# Patient Record
Sex: Male | Born: 1975 | Race: Black or African American | Hispanic: No | Marital: Single | Smoking: Never smoker
Health system: Southern US, Community
[De-identification: ages and names within clinical notes are randomized; demographics above are authoritative.]

## PROBLEM LIST (undated history)

## (undated) ENCOUNTER — Ambulatory Visit (HOSPITAL_COMMUNITY): Admission: EM | Payer: BLUE CROSS/BLUE SHIELD

## (undated) HISTORY — PX: HAND SURGERY: SHX662

## (undated) HISTORY — PX: OTHER SURGICAL HISTORY: SHX169

## (undated) HISTORY — PX: HEEL SPUR SURGERY: SHX665

---

## 1998-02-09 ENCOUNTER — Inpatient Hospital Stay (HOSPITAL_COMMUNITY): Admission: EM | Admit: 1998-02-09 | Discharge: 1998-02-12 | Payer: Self-pay | Admitting: Emergency Medicine

## 1998-02-10 ENCOUNTER — Encounter: Payer: Self-pay | Admitting: Orthopedic Surgery

## 1998-04-26 ENCOUNTER — Encounter: Admission: RE | Admit: 1998-04-26 | Discharge: 1998-07-25 | Payer: Self-pay | Admitting: Orthopedic Surgery

## 1999-09-02 ENCOUNTER — Encounter: Payer: Self-pay | Admitting: Emergency Medicine

## 1999-09-02 ENCOUNTER — Inpatient Hospital Stay (HOSPITAL_COMMUNITY): Admission: EM | Admit: 1999-09-02 | Discharge: 1999-09-18 | Payer: Self-pay

## 1999-09-03 ENCOUNTER — Encounter: Payer: Self-pay | Admitting: General Surgery

## 1999-09-04 ENCOUNTER — Encounter: Payer: Self-pay | Admitting: General Surgery

## 1999-09-05 ENCOUNTER — Encounter: Payer: Self-pay | Admitting: General Surgery

## 1999-09-06 ENCOUNTER — Encounter: Payer: Self-pay | Admitting: Surgery

## 1999-09-16 ENCOUNTER — Encounter: Payer: Self-pay | Admitting: General Surgery

## 1999-09-18 ENCOUNTER — Inpatient Hospital Stay (HOSPITAL_COMMUNITY)
Admission: RE | Admit: 1999-09-18 | Discharge: 1999-10-04 | Payer: Self-pay | Admitting: Physical Medicine and Rehabilitation

## 1999-09-25 ENCOUNTER — Encounter: Payer: Self-pay | Admitting: Physical Medicine and Rehabilitation

## 1999-09-25 ENCOUNTER — Encounter: Payer: Self-pay | Admitting: Orthopedic Surgery

## 1999-10-02 ENCOUNTER — Encounter: Payer: Self-pay | Admitting: Orthopedic Surgery

## 1999-10-06 ENCOUNTER — Encounter: Payer: Self-pay | Admitting: Emergency Medicine

## 1999-10-06 ENCOUNTER — Inpatient Hospital Stay (HOSPITAL_COMMUNITY): Admission: EM | Admit: 1999-10-06 | Discharge: 1999-10-08 | Payer: Self-pay | Admitting: Emergency Medicine

## 1999-11-13 ENCOUNTER — Encounter: Admission: RE | Admit: 1999-11-13 | Discharge: 2000-02-11 | Payer: Self-pay | Admitting: Orthopedic Surgery

## 2001-09-01 ENCOUNTER — Emergency Department (HOSPITAL_COMMUNITY): Admission: EM | Admit: 2001-09-01 | Discharge: 2001-09-01 | Payer: Self-pay | Admitting: Emergency Medicine

## 2001-09-01 ENCOUNTER — Encounter: Payer: Self-pay | Admitting: Emergency Medicine

## 2007-07-04 ENCOUNTER — Emergency Department (HOSPITAL_COMMUNITY): Admission: EM | Admit: 2007-07-04 | Discharge: 2007-07-04 | Payer: Self-pay | Admitting: Emergency Medicine

## 2007-07-15 ENCOUNTER — Emergency Department (HOSPITAL_COMMUNITY): Admission: EM | Admit: 2007-07-15 | Discharge: 2007-07-15 | Payer: Self-pay | Admitting: Emergency Medicine

## 2007-12-07 ENCOUNTER — Emergency Department (HOSPITAL_COMMUNITY): Admission: EM | Admit: 2007-12-07 | Discharge: 2007-12-07 | Payer: Self-pay | Admitting: Family Medicine

## 2008-03-17 ENCOUNTER — Encounter: Payer: Self-pay | Admitting: Pulmonary Disease

## 2008-03-17 DIAGNOSIS — R0602 Shortness of breath: Secondary | ICD-10-CM | POA: Insufficient documentation

## 2008-11-14 ENCOUNTER — Emergency Department (HOSPITAL_COMMUNITY): Admission: EM | Admit: 2008-11-14 | Discharge: 2008-11-14 | Payer: Self-pay | Admitting: Emergency Medicine

## 2010-04-26 ENCOUNTER — Emergency Department (HOSPITAL_COMMUNITY)
Admission: EM | Admit: 2010-04-26 | Discharge: 2010-04-26 | Disposition: A | Discharge status: Home / Self Care | Payer: Self-pay | Attending: Emergency Medicine | Admitting: Emergency Medicine

## 2010-04-26 DIAGNOSIS — R300 Dysuria: Secondary | ICD-10-CM | POA: Insufficient documentation

## 2010-04-26 DIAGNOSIS — A64 Unspecified sexually transmitted disease: Secondary | ICD-10-CM | POA: Insufficient documentation

## 2010-04-26 LAB — URINALYSIS, ROUTINE W REFLEX MICROSCOPIC
Bilirubin Urine: NEGATIVE
Glucose, UA: NEGATIVE mg/dL
Ketones, ur: NEGATIVE mg/dL
Nitrite: NEGATIVE
Protein, ur: 30 mg/dL — AB
Specific Gravity, Urine: 1.017 (ref 1.005–1.030)
Urobilinogen, UA: 0.2 mg/dL (ref 0.0–1.0)
pH: 6 (ref 5.0–8.0)

## 2010-04-26 LAB — URINE MICROSCOPIC-ADD ON

## 2010-05-09 LAB — GC/CHLAMYDIA PROBE AMP, GENITAL
Chlamydia, DNA Probe: NEGATIVE
GC Probe Amp, Genital: NEGATIVE
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
## 2011-09-01 ENCOUNTER — Emergency Department (INDEPENDENT_AMBULATORY_CARE_PROVIDER_SITE_OTHER)
Admission: EM | Admit: 2011-09-01 | Discharge: 2011-09-01 | Disposition: A | Discharge status: Home / Self Care | Payer: Self-pay | Source: Home / Self Care | Attending: Emergency Medicine | Admitting: Emergency Medicine

## 2011-09-01 ENCOUNTER — Encounter (HOSPITAL_COMMUNITY): Payer: Self-pay

## 2011-09-01 DIAGNOSIS — K122 Cellulitis and abscess of mouth: Secondary | ICD-10-CM

## 2011-09-01 MED ORDER — PREDNISONE 5 MG PO KIT: 1.0000 | PACK | Freq: Every day | ORAL | Status: DC

## 2011-09-01 MED ORDER — AMOXICILLIN 500 MG PO CAPS: 1000.0000 mg | ORAL_CAPSULE | Freq: Three times a day (TID) | ORAL | Status: AC

## 2011-09-01 NOTE — ED Provider Notes (Signed)
Chief Complaint  Patient presents with  . Sore Throat    History of Present Illness:   The patient is a 36 year old male with a one-week history of sore throat, dry throat, and pain on swallowing. He's felt feverish and had some sweats but he also notes rhinorrhea, headache, and cough. He denies any difficulty breathing, nasal congestion, wheezing, chest tightness, chest pain, nausea, vomiting, or diarrhea.  Review of Systems:  Other than as noted above, the patient denies any of the following symptoms. Systemic:  No fever, chills, sweats, fatigue, myalgias, headache, or anorexia. Eye:  No redness, pain or drainage. ENT:  No earache, ear congestion, nasal congestion, sneezing, rhinorrhea, sinus pressure, sinus pain, or post nasal drip. Lungs:  No cough, sputum production, wheezing, shortness of breath, or chest pain. GI:  No abdominal pain, nausea, vomiting, or diarrhea. Skin:  No rash or itching.  PMFSH:  Past medical history, family history, social history, meds, allergies, and nurse's notes were reviewed.  There is no known exposure to strep or mono.  No prior history of step or mono.  The patient denies use of tobacco.  Physical Exam:   Vital signs:  BP 134/94  Pulse 85  Temp 97.7 F (36.5 C) (Oral)  Resp 19  SpO2 96% General:  Alert, in no distress. Eye:  No conjunctival injection or drainage. Lids were normal. ENT:  TMs and canals were normal, without erythema or inflammation.  Nasal mucosa was clear and uncongested, without drainage.  Mucous membranes were moist.  Exam of pharynx reveals the uvula to be markedly swollen and slightly erythematous. There were no ulcerations of the uvula no exudate. Tonsils are normal and there are no other intraoral lesions.  There were no oral ulcerations or lesions. Neck:  Supple, no adenopathy, tenderness or mass. Lungs:  No respiratory distress.  Lungs were clear to auscultation, without wheezes, rales or rhonchi.  Breath sounds were clear and  equal bilaterally.  Heart:  Regular rhythm, without gallops, murmers or rubs. Skin:  Clear, warm, and dry, without rash or lesions.  Labs:   Results for orders placed during the hospital encounter of 09/01/11  POCT RAPID STREP A ([facility] URG CARE ONLY)      Component Value Range   Streptococcus, Group A Screen (Direct) NEGATIVE  NEGATIVE    Assessment:  The encounter diagnosis was Uvulitis.  Plan:   1.  The following meds were prescribed:   New Prescriptions   AMOXICILLIN (AMOXIL) 500 MG CAPSULE    Take 2 capsules (1,000 mg total) by mouth 3 (three) times daily.   PREDNISONE 5 MG KIT    Take 1 kit (5 mg total) by mouth daily after breakfast. Prednisone 5 mg 6 day dosepack.  Take as directed.   2.  The patient was instructed in symptomatic care including hot saline gargles, throat lozenges, infectious precautions, and need to trade out toothbrush. Handouts were given. 3.  The patient was told to return if becoming worse in any way, if no better in 3 or 4 days, and given some red flag symptoms that would indicate earlier return.    Reuben Likes, MD 09/01/11 [phone]

## 2011-09-01 NOTE — ED Notes (Signed)
C/o 1 week or so duration of ST, pain w swallowing; used OTC spray, lozenges w minimal relief; NAD

## 2013-01-17 ENCOUNTER — Encounter (HOSPITAL_COMMUNITY): Payer: Self-pay | Admitting: Emergency Medicine

## 2013-01-17 ENCOUNTER — Emergency Department (HOSPITAL_COMMUNITY)
Admission: EM | Admit: 2013-01-17 | Discharge: 2013-01-17 | Discharge status: Against Medical Advice | Payer: Self-pay | Attending: Emergency Medicine | Admitting: Emergency Medicine

## 2013-01-17 DIAGNOSIS — R509 Fever, unspecified: Secondary | ICD-10-CM | POA: Insufficient documentation

## 2013-01-17 DIAGNOSIS — R110 Nausea: Secondary | ICD-10-CM | POA: Insufficient documentation

## 2013-01-17 DIAGNOSIS — J029 Acute pharyngitis, unspecified: Secondary | ICD-10-CM | POA: Insufficient documentation

## 2013-01-17 DIAGNOSIS — R059 Cough, unspecified: Secondary | ICD-10-CM | POA: Insufficient documentation

## 2013-01-17 DIAGNOSIS — R05 Cough: Secondary | ICD-10-CM | POA: Insufficient documentation

## 2013-01-17 NOTE — ED Notes (Signed)
Pt with cough, sore throat and fever since yesterday

## 2013-01-21 ENCOUNTER — Encounter (HOSPITAL_COMMUNITY): Payer: Self-pay | Admitting: Emergency Medicine

## 2013-01-21 ENCOUNTER — Emergency Department (HOSPITAL_COMMUNITY): Payer: Self-pay

## 2013-01-21 ENCOUNTER — Emergency Department (HOSPITAL_COMMUNITY)
Admission: EM | Admit: 2013-01-21 | Discharge: 2013-01-21 | Disposition: A | Discharge status: Home / Self Care | Payer: Self-pay | Attending: Emergency Medicine | Admitting: Emergency Medicine

## 2013-01-21 DIAGNOSIS — R0602 Shortness of breath: Secondary | ICD-10-CM | POA: Insufficient documentation

## 2013-01-21 DIAGNOSIS — R0989 Other specified symptoms and signs involving the circulatory and respiratory systems: Secondary | ICD-10-CM

## 2013-01-21 DIAGNOSIS — R062 Wheezing: Secondary | ICD-10-CM | POA: Insufficient documentation

## 2013-01-21 DIAGNOSIS — R51 Headache: Secondary | ICD-10-CM | POA: Insufficient documentation

## 2013-01-21 DIAGNOSIS — R059 Cough, unspecified: Secondary | ICD-10-CM | POA: Insufficient documentation

## 2013-01-21 DIAGNOSIS — R509 Fever, unspecified: Secondary | ICD-10-CM | POA: Insufficient documentation

## 2013-01-21 DIAGNOSIS — J3489 Other specified disorders of nose and nasal sinuses: Secondary | ICD-10-CM | POA: Insufficient documentation

## 2013-01-21 DIAGNOSIS — R05 Cough: Secondary | ICD-10-CM | POA: Insufficient documentation

## 2013-01-21 DIAGNOSIS — F17200 Nicotine dependence, unspecified, uncomplicated: Secondary | ICD-10-CM | POA: Insufficient documentation

## 2013-01-21 DIAGNOSIS — R0789 Other chest pain: Secondary | ICD-10-CM | POA: Insufficient documentation

## 2013-01-21 MED ORDER — ALBUTEROL SULFATE HFA 108 (90 BASE) MCG/ACT IN AERS
2.0000 | INHALATION_SPRAY | RESPIRATORY_TRACT | Status: DC | PRN
  Administered 2013-01-21: 2 via RESPIRATORY_TRACT
  Filled 2013-01-21: qty 6.7

## 2013-01-21 MED ORDER — ACETAMINOPHEN 325 MG PO TABS
650.0000 mg | ORAL_TABLET | Freq: Once | ORAL | Status: AC
  Administered 2013-01-21: 650 mg via ORAL
  Filled 2013-01-21: qty 2

## 2013-01-21 MED ORDER — ACETAMINOPHEN-CODEINE 120-12 MG/5ML PO SUSP: 5.0000 mL | Freq: Four times a day (QID) | ORAL | Status: DC | PRN
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
## 2013-01-21 NOTE — ED Notes (Addendum)
Pt c/o cough and shortness of breath onset earlier this week. Pt reports mucus build up in throat and chest. Pt denies being seen at [facility] ED recently.

## 2013-01-21 NOTE — ED Provider Notes (Signed)
Medical screening examination/treatment/procedure(s) were performed by non-physician practitioner and as supervising physician I was immediately available for consultation/collaboration.  EKG Interpretation   None         Gwyneth Sprout, MD 01/21/13 1530

## 2013-01-21 NOTE — ED Notes (Signed)
Pt states cough and cold for a few days lots of congestion

## 2013-12-25 ENCOUNTER — Encounter (HOSPITAL_COMMUNITY): Payer: Self-pay | Admitting: Physical Medicine and Rehabilitation

## 2013-12-25 ENCOUNTER — Emergency Department (HOSPITAL_COMMUNITY)
Admission: EM | Admit: 2013-12-25 | Discharge: 2013-12-25 | Disposition: A | Discharge status: Home / Self Care | Payer: BC Managed Care – PPO | Attending: Emergency Medicine | Admitting: Emergency Medicine

## 2013-12-25 DIAGNOSIS — Y939 Activity, unspecified: Secondary | ICD-10-CM | POA: Insufficient documentation

## 2013-12-25 DIAGNOSIS — W01198A Fall on same level from slipping, tripping and stumbling with subsequent striking against other object, initial encounter: Secondary | ICD-10-CM | POA: Diagnosis not present

## 2013-12-25 DIAGNOSIS — Y929 Unspecified place or not applicable: Secondary | ICD-10-CM | POA: Diagnosis not present

## 2013-12-25 DIAGNOSIS — Z23 Encounter for immunization: Secondary | ICD-10-CM | POA: Diagnosis not present

## 2013-12-25 DIAGNOSIS — Y998 Other external cause status: Secondary | ICD-10-CM | POA: Diagnosis not present

## 2013-12-25 DIAGNOSIS — S01511A Laceration without foreign body of lip, initial encounter: Secondary | ICD-10-CM | POA: Diagnosis not present

## 2013-12-25 DIAGNOSIS — S00501A Unspecified superficial injury of lip, initial encounter: Secondary | ICD-10-CM | POA: Diagnosis present

## 2013-12-25 MED ORDER — LIDOCAINE-EPINEPHRINE 2 %-1:100000 IJ SOLN
20.0000 mL | Freq: Once | INTRAMUSCULAR | Status: DC
  Filled 2013-12-25 (×3): qty 20

## 2013-12-25 MED ORDER — LIDOCAINE-EPINEPHRINE (PF) 1.5 %-1:200000 IJ SOLN
30.0000 mL | Freq: Once | INTRAMUSCULAR | Status: AC
  Administered 2013-12-25: 30 mL

## 2013-12-25 MED ORDER — TETANUS-DIPHTH-ACELL PERTUSSIS 5-2.5-18.5 LF-MCG/0.5 IM SUSP
0.5000 mL | Freq: Once | INTRAMUSCULAR | Status: AC
  Administered 2013-12-25: 0.5 mL via INTRAMUSCULAR
  Filled 2013-12-25: qty 0.5
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
## 2013-12-25 NOTE — ED Notes (Signed)
PA at bedside performing suture care.

## 2013-12-25 NOTE — Discharge Instructions (Signed)
Keep wound dry and do not remove dressing for 24 hours if possible. After that, wash gently morning and night (every 12 hours) with soap and water. Use a topical antibiotic ointment and cover with a bandaid or gauze.    Do NOT use rubbing alcohol or hydrogen peroxide, do not soak the area   Present to your primary care doctor or the urgent care of your choice, or the ED for wound recheck in 5-7 days.   Every attempt was made to remove foreign body (contaminants) from the wound.  However, there is always a chance that some may remain in the wound. This can  increase your risk of infection.   If you see signs of infection (warmth, redness, tenderness, pus, sharp increase in pain, fever, red streaking in the skin) immediately return to the emergency department.   After the wound heals fully, apply sunscreen for 6-12 months to minimize scarring.   Rinse mouth out with salt water gargle after eating.  Soft foods for 3-5 days until healed.     Absorbable Suture Repair Absorbable sutures (stitches) hold skin together so you can heal. Keep skin wounds clean and dry for the next 2 to 3 days. Then, you may gently wash your wound and dress it with an antibiotic ointment as recommended. As your wound begins to heal, the sutures are no longer needed, and they typically begin to fall off. This will take 7 to 10 days. After 10 days, if your sutures are loose, you can remove them by wiping with a clean gauze pad or a cotton ball. Do not pull your sutures out. They should wipe away easily. If after 10 days they do not easily wipe away, have your caregiver take them out. Absorbable sutures may be used deep in a wound to help hold it together. If these stitches are below the skin, the body will absorb them completely in 3 to 4 weeks.  You may need a tetanus shot if:  You cannot remember when you had your last tetanus shot.  You have never had a tetanus shot. If you get a tetanus shot, your arm may swell, get  red, and feel warm to the touch. This is common and not a problem. If you need a tetanus shot and you choose not to have one, there is a rare chance of getting tetanus. Sickness from tetanus can be serious. SEEK IMMEDIATE MEDICAL CARE IF:  You have redness in the wound area.  The wound area feels hot to the touch.  You develop swelling in the wound area.  You develop pain.  There is fluid drainage from the wound. Document Released: 02/28/2004 Document Revised: 04/14/2011 Document Reviewed: 06/11/2010 [facility] Patient Information 2015 [place], [place]. This information is not intended to replace advice given to you by your health care provider. Make sure you discuss any questions you have with your health care provider.   Facial Laceration  A facial laceration is a cut on the face. These injuries can be painful and cause bleeding. Lacerations usually heal quickly, but they need special care to reduce scarring. DIAGNOSIS  Your health care provider will take a medical history, ask for details about how the injury occurred, and examine the wound to determine how deep the cut is. TREATMENT  Some facial lacerations may not require closure. Others may not be able to be closed because of an increased risk of infection. The risk of infection and the chance for successful closure will depend on various factors,  including the amount of time since the injury occurred. The wound may be cleaned to help prevent infection. If closure is appropriate, pain medicines may be given if needed. Your health care provider will use stitches (sutures), wound glue (adhesive), or skin adhesive strips to repair the laceration. These tools bring the skin edges together to allow for faster healing and a better cosmetic outcome. If needed, you may also be given a tetanus shot. HOME CARE INSTRUCTIONS  Only take over-the-counter or prescription medicines as directed by your health care provider.  Follow your health care  provider's instructions for wound care. These instructions will vary depending on the technique used for closing the wound. For Sutures:  Keep the wound clean and dry.   If you were given a bandage (dressing), you should change it at least once a day. Also change the dressing if it becomes wet or dirty, or as directed by your health care provider.   Wash the wound with soap and water 2 times a day. Rinse the wound off with water to remove all soap. Pat the wound dry with a clean towel.   After cleaning, apply a thin layer of the antibiotic ointment recommended by your health care provider. This will help prevent infection and keep the dressing from sticking.   You may shower as usual after the first 24 hours. Do not soak the wound in water until the sutures are removed.   Get your sutures removed as directed by your health care provider. With facial lacerations, sutures should usually be taken out after 4-5 days to avoid stitch marks.   Wait a few days after your sutures are removed before applying any makeup. For Skin Adhesive Strips:  Keep the wound clean and dry.   Do not get the skin adhesive strips wet. You may bathe carefully, using caution to keep the wound dry.   If the wound gets wet, pat it dry with a clean towel.   Skin adhesive strips will fall off on their own. You may trim the strips as the wound heals. Do not remove skin adhesive strips that are still stuck to the wound. They will fall off in time.  For Wound Adhesive:  You may briefly wet your wound in the shower or bath. Do not soak or scrub the wound. Do not swim. Avoid periods of heavy sweating until the skin adhesive has fallen off on its own. After showering or bathing, gently pat the wound dry with a clean towel.   Do not apply liquid medicine, cream medicine, ointment medicine, or makeup to your wound while the skin adhesive is in place. This may loosen the film before your wound is healed.   If a  dressing is placed over the wound, be careful not to apply tape directly over the skin adhesive. This may cause the adhesive to be pulled off before the wound is healed.   Avoid prolonged exposure to sunlight or tanning lamps while the skin adhesive is in place.  The skin adhesive will usually remain in place for 5-10 days, then naturally fall off the skin. Do not pick at the adhesive film.  After Healing: Once the wound has healed, cover the wound with sunscreen during the day for 1 full year. This can help minimize scarring. Exposure to ultraviolet light in the first year will darken the scar. It can take 1-2 years for the scar to lose its redness and to heal completely.  SEEK IMMEDIATE MEDICAL CARE IF:  You  have redness, pain, or swelling around the wound.   You see ayellowish-white fluid (pus) coming from the wound.   You have chills or a fever.  MAKE SURE YOU:  Understand these instructions.  Will watch your condition.  Will get help right away if you are not doing well or get worse. Document Released: 02/28/2004 Document Revised: 11/10/2012 Document Reviewed: 09/02/2012 [facility] Patient Information 2015 [place], [place]. This information is not intended to replace advice given to you by your health care provider. Make sure you discuss any questions you have with your health care provider.

## 2015-02-20 ENCOUNTER — Emergency Department (INDEPENDENT_AMBULATORY_CARE_PROVIDER_SITE_OTHER)
Admission: EM | Admit: 2015-02-20 | Discharge: 2015-02-20 | Disposition: A | Discharge status: Home / Self Care | Payer: BLUE CROSS/BLUE SHIELD | Source: Home / Self Care | Attending: Emergency Medicine | Admitting: Emergency Medicine

## 2015-02-20 ENCOUNTER — Encounter (HOSPITAL_COMMUNITY): Payer: Self-pay | Admitting: Emergency Medicine

## 2015-02-20 DIAGNOSIS — H538 Other visual disturbances: Secondary | ICD-10-CM

## 2015-02-20 DIAGNOSIS — H578 Other specified disorders of eye and adnexa: Secondary | ICD-10-CM | POA: Diagnosis not present

## 2015-02-20 DIAGNOSIS — H5789 Other specified disorders of eye and adnexa: Secondary | ICD-10-CM

## 2015-02-20 NOTE — Discharge Instructions (Signed)
Please follow-up with Dr. Sherryll Burger tomorrow morning at 8 AM. In the meantime, you can try some Tylenol or ibuprofen to help with the discomfort.
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
## 2016-12-30 ENCOUNTER — Emergency Department (HOSPITAL_COMMUNITY)
Admission: EM | Admit: 2016-12-30 | Discharge: 2016-12-30 | Disposition: A | Discharge status: Home / Self Care | Payer: BLUE CROSS/BLUE SHIELD | Attending: Emergency Medicine | Admitting: Emergency Medicine

## 2016-12-30 ENCOUNTER — Encounter (HOSPITAL_COMMUNITY): Payer: Self-pay | Admitting: Emergency Medicine

## 2016-12-30 ENCOUNTER — Emergency Department (HOSPITAL_COMMUNITY): Payer: BLUE CROSS/BLUE SHIELD

## 2016-12-30 DIAGNOSIS — S161XXA Strain of muscle, fascia and tendon at neck level, initial encounter: Secondary | ICD-10-CM

## 2016-12-30 DIAGNOSIS — Y939 Activity, unspecified: Secondary | ICD-10-CM | POA: Insufficient documentation

## 2016-12-30 DIAGNOSIS — Y92481 Parking lot as the place of occurrence of the external cause: Secondary | ICD-10-CM | POA: Diagnosis not present

## 2016-12-30 DIAGNOSIS — S060X0A Concussion without loss of consciousness, initial encounter: Secondary | ICD-10-CM | POA: Insufficient documentation

## 2016-12-30 DIAGNOSIS — Y999 Unspecified external cause status: Secondary | ICD-10-CM | POA: Insufficient documentation

## 2016-12-30 DIAGNOSIS — S29019A Strain of muscle and tendon of unspecified wall of thorax, initial encounter: Secondary | ICD-10-CM | POA: Insufficient documentation

## 2016-12-30 MED ORDER — METAXALONE 800 MG PO TABS: 400.0000 mg | ORAL_TABLET | Freq: Three times a day (TID) | ORAL | 0 refills | Status: DC | PRN
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
## 2016-12-30 NOTE — Discharge Instructions (Signed)
Follow up with a primary care doctor as needed for continued pain.
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
## 2017-09-01 ENCOUNTER — Ambulatory Visit (HOSPITAL_COMMUNITY)
Admission: EM | Admit: 2017-09-01 | Discharge: 2017-09-01 | Disposition: A | Discharge status: Home / Self Care | Payer: BLUE CROSS/BLUE SHIELD | Attending: Internal Medicine | Admitting: Internal Medicine

## 2017-09-01 ENCOUNTER — Encounter (HOSPITAL_COMMUNITY): Payer: Self-pay

## 2017-09-01 DIAGNOSIS — Z202 Contact with and (suspected) exposure to infections with a predominantly sexual mode of transmission: Secondary | ICD-10-CM | POA: Diagnosis not present

## 2017-09-01 DIAGNOSIS — R300 Dysuria: Secondary | ICD-10-CM | POA: Diagnosis present

## 2017-09-01 DIAGNOSIS — R350 Frequency of micturition: Secondary | ICD-10-CM | POA: Diagnosis not present

## 2017-09-01 DIAGNOSIS — Z113 Encounter for screening for infections with a predominantly sexual mode of transmission: Secondary | ICD-10-CM | POA: Diagnosis not present

## 2017-09-01 DIAGNOSIS — Z79899 Other long term (current) drug therapy: Secondary | ICD-10-CM | POA: Diagnosis not present

## 2017-09-01 LAB — POCT URINALYSIS DIP (DEVICE)
BILIRUBIN URINE: NEGATIVE
Glucose, UA: NEGATIVE mg/dL
Ketones, ur: NEGATIVE mg/dL
NITRITE: NEGATIVE
PH: 5.5 (ref 5.0–8.0)
Specific Gravity, Urine: 1.02 (ref 1.005–1.030)
Urobilinogen, UA: 0.2 mg/dL (ref 0.0–1.0)

## 2017-09-01 MED ORDER — CEFTRIAXONE SODIUM 250 MG IJ SOLR
250.0000 mg | Freq: Once | INTRAMUSCULAR | Status: AC
  Administered 2017-09-01: 250 mg via INTRAMUSCULAR

## 2017-09-01 MED ORDER — CEFTRIAXONE SODIUM 250 MG IJ SOLR
INTRAMUSCULAR | Status: AC
  Filled 2017-09-01: qty 250

## 2017-09-01 MED ORDER — AZITHROMYCIN 250 MG PO TABS
1000.0000 mg | ORAL_TABLET | Freq: Once | ORAL | Status: AC
  Administered 2017-09-01: 1000 mg via ORAL

## 2017-09-01 MED ORDER — AZITHROMYCIN 250 MG PO TABS
ORAL_TABLET | ORAL | Status: AC
  Filled 2017-09-01: qty 4

## 2017-09-01 NOTE — ED Triage Notes (Signed)
Pt presents with discomfort with urination

## 2017-09-01 NOTE — ED Provider Notes (Signed)
[facility]    CSN: [id number] Arrival date & time: 09/01/17  1416     History   Chief Complaint Chief Complaint  Patient presents with  . Discomfort with urination    HPI Jose Boyd is a 42 y.o. male.   Patient is a healthy 42 year old male that presents with 4 days of urinary frequency and dysuria.  This problem has been constant and remain the same.  He has not taken anything for his symptoms.  He denies any fever, chills, body aches, testicular swelling, penile swelling, penile discharge, rashes, rectal pain or pressure. He denies any history of prostate issues. He admits to being sexually active with multiple partners and unprotected sex.   ROS per HPI      History reviewed. No pertinent past medical history.  Patient Active Problem List   Diagnosis Date Noted  . SHORTNESS OF BREATH 03/17/2008    Past Surgical History:  Procedure Laterality Date  . arm surgery Left   . HAND SURGERY Left   . HEEL SPUR SURGERY         Home Medications    Prior to Admission medications   Medication Sig Start Date End Date Taking? Authorizing Provider  acetaminophen-codeine 120-12 MG/5ML suspension Take 5 mLs by mouth every 6 (six) hours as needed for pain. Patient not taking: Reported on 12/30/2016 01/21/13   Lurene ShadowPhelps, Erin O, PA-C  metaxalone (SKELAXIN) 800 MG tablet Take 0.5 tablets (400 mg total) by mouth 3 (three) times daily as needed for muscle spasms. 12/30/16   Benjiman CorePickering, Nathan, MD    Family History Family History  Problem Relation Age of Onset  . Healthy Mother   . Healthy Father     Social History Social History   Tobacco Use  . Smoking status: Never Smoker  . Smokeless tobacco: Never Used  Substance Use Topics  . Alcohol use: No  . Drug use: No     Allergies   Patient has no known allergies.   Review of Systems Review of Systems   Physical Exam Triage Vital Signs ED Triage Vitals  Enc Vitals Group     BP 09/01/17 1516 (!)  147/98     Pulse Rate 09/01/17 1516 94     Resp 09/01/17 1516 20     Temp 09/01/17 1516 98.5 F (36.9 C)     Temp Source 09/01/17 1516 Temporal     SpO2 09/01/17 1516 100 %     Weight --      Height --      Head Circumference --      Peak Flow --      Pain Score 09/01/17 1515 6     Pain Loc --      Pain Edu? --      Excl. in GC? --    No data found.  Updated Vital Signs BP (!) 147/98 (BP Location: Left Arm)   Pulse 94   Temp 98.5 F (36.9 C) (Temporal)   Resp 20   SpO2 100%   Visual Acuity Right Eye Distance:   Left Eye Distance:   Bilateral Distance:    Right Eye Near:   Left Eye Near:    Bilateral Near:     Physical Exam  Constitutional: He is oriented to person, place, and time. He appears well-developed and well-nourished.  HENT:  Head: Normocephalic and atraumatic.  Neck: Normal range of motion.  Pulmonary/Chest: Effort normal.  Abdominal: Soft. Bowel sounds are normal. He exhibits no  distension and no mass. There is no tenderness. There is no rebound and no guarding. No hernia.  Neurological: He is alert and oriented to person, place, and time.  Skin: Skin is warm and dry. Capillary refill takes less than 2 seconds.  Psychiatric: He has a normal mood and affect.  Nursing note and vitals reviewed.    UC Treatments / Results  Labs (all labs ordered are listed, but only abnormal results are displayed) Labs Reviewed  POCT URINALYSIS DIP (DEVICE) - Abnormal; Notable for the following components:      Result Value   Hgb urine dipstick TRACE (*)    Protein, ur >=300 (*)    Leukocytes, UA SMALL (*)    All other components within normal limits  URINE CULTURE  URINE CYTOLOGY ANCILLARY ONLY    EKG None  Radiology No results found.  Procedures Procedures (including critical care time)  Medications Ordered in UC Medications  cefTRIAXone (ROCEPHIN) injection 250 mg (has no administration in time range)  azithromycin (ZITHROMAX) tablet 1,000 mg (has  no administration in time range)    Initial Impression / Assessment and Plan / UC Course  I have reviewed the triage vital signs and the nursing notes.  Pertinent labs & imaging results that were available during my care of the patient were reviewed by me and considered in my medical decision making (see chart for details).    Urine showed trace leuks and trace hemoglobin.  Based on history we will go ahead and treat for STDs in clinic.  We will send urine for cytology and culture.  We will call with results if anything else needs to be treated.  Patient understanding and agreeable to plan.  Final Clinical Impressions(s) / UC Diagnoses   Final diagnoses:  Dysuria     Discharge Instructions     It was nice meeting you!!  We will go ahead and treat you for STDs today and send your urine for cultures. We will call you if anything results that needs to be treated.  Refrain from sexual intercourse for at least 7 days.    ED Prescriptions    None     Controlled Substance Prescriptions [place] Controlled Substance Registry consulted? no   Janace Aris, NP 09/01/17 1606

## 2017-09-01 NOTE — Discharge Instructions (Addendum)
It was nice meeting you!!  We will go ahead and treat you for STDs today and send your urine for cultures. We will call you if anything results that needs to be treated.  Refrain from sexual intercourse for at least 7 days.

## 2017-09-02 LAB — URINE CULTURE: CULTURE: NO GROWTH

## 2017-09-02 LAB — URINE CYTOLOGY ANCILLARY ONLY
Chlamydia: NEGATIVE
NEISSERIA GONORRHEA: NEGATIVE
TRICH (WINDOWPATH): NEGATIVE

## 2017-09-04 LAB — URINE CYTOLOGY ANCILLARY ONLY
BACTERIAL VAGINITIS: NEGATIVE
CANDIDA VAGINITIS: NEGATIVE

## 2018-11-18 ENCOUNTER — Encounter (HOSPITAL_COMMUNITY): Payer: Self-pay | Admitting: Emergency Medicine

## 2018-11-18 ENCOUNTER — Emergency Department (HOSPITAL_COMMUNITY)
Admission: EM | Admit: 2018-11-18 | Discharge: 2018-11-18 | Disposition: A | Discharge status: Home / Self Care | Payer: BLUE CROSS/BLUE SHIELD | Attending: Emergency Medicine | Admitting: Emergency Medicine

## 2018-11-18 ENCOUNTER — Other Ambulatory Visit: Payer: Self-pay

## 2018-11-18 ENCOUNTER — Emergency Department (HOSPITAL_COMMUNITY): Payer: BLUE CROSS/BLUE SHIELD

## 2018-11-18 DIAGNOSIS — M79672 Pain in left foot: Secondary | ICD-10-CM | POA: Insufficient documentation

## 2018-11-18 MED ORDER — HYDROCODONE-ACETAMINOPHEN 5-325 MG PO TABS
1.0000 | ORAL_TABLET | Freq: Once | ORAL | Status: AC
  Administered 2018-11-18: 1 via ORAL
  Filled 2018-11-18: qty 1

## 2018-11-18 MED ORDER — IBUPROFEN 200 MG PO TABS
600.0000 mg | ORAL_TABLET | Freq: Once | ORAL | Status: AC
  Administered 2018-11-18: 600 mg via ORAL
  Filled 2018-11-18: qty 3
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
## 2018-11-23 ENCOUNTER — Ambulatory Visit (INDEPENDENT_AMBULATORY_CARE_PROVIDER_SITE_OTHER): Payer: BLUE CROSS/BLUE SHIELD | Admitting: Orthopaedic Surgery

## 2018-11-23 ENCOUNTER — Encounter: Payer: Self-pay | Admitting: Orthopaedic Surgery

## 2018-11-23 ENCOUNTER — Other Ambulatory Visit: Payer: Self-pay

## 2018-11-23 DIAGNOSIS — M795 Residual foreign body in soft tissue: Secondary | ICD-10-CM
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
## 2018-12-01 ENCOUNTER — Other Ambulatory Visit: Payer: Self-pay

## 2018-12-01 ENCOUNTER — Telehealth: Payer: Self-pay

## 2018-12-01 DIAGNOSIS — M795 Residual foreign body in soft tissue: Secondary | ICD-10-CM

## 2018-12-01 DIAGNOSIS — M79672 Pain in left foot: Secondary | ICD-10-CM

## 2018-12-01 NOTE — Telephone Encounter (Signed)
Patient would like to go in network for his surgery.  Would you be able to refer him to someone at [facility]?

## 2018-12-01 NOTE — Telephone Encounter (Signed)
[facility]

## 2018-12-01 NOTE — Telephone Encounter (Signed)
REFERRAL MADE. PATIENT AWARE.

## 2018-12-09 ENCOUNTER — Inpatient Hospital Stay: Payer: BLUE CROSS/BLUE SHIELD | Admitting: Orthopaedic Surgery

## 2019-08-30 ENCOUNTER — Other Ambulatory Visit: Payer: Self-pay

## 2019-08-30 ENCOUNTER — Ambulatory Visit (HOSPITAL_COMMUNITY)
Admission: EM | Admit: 2019-08-30 | Discharge: 2019-08-30 | Disposition: A | Discharge status: Home / Self Care | Payer: BLUE CROSS/BLUE SHIELD | Attending: Urgent Care | Admitting: Urgent Care

## 2019-08-30 ENCOUNTER — Encounter (HOSPITAL_COMMUNITY): Payer: Self-pay

## 2019-08-30 DIAGNOSIS — I10 Essential (primary) hypertension: Secondary | ICD-10-CM | POA: Diagnosis present

## 2019-08-30 DIAGNOSIS — Z202 Contact with and (suspected) exposure to infections with a predominantly sexual mode of transmission: Secondary | ICD-10-CM | POA: Diagnosis not present

## 2019-08-30 MED ORDER — AZITHROMYCIN 250 MG PO TABS
ORAL_TABLET | ORAL | Status: AC
  Filled 2019-08-30: qty 4

## 2019-08-30 MED ORDER — LOSARTAN POTASSIUM 50 MG PO TABS: 50.0000 mg | ORAL_TABLET | Freq: Every day | ORAL | 0 refills | Status: DC

## 2019-08-30 MED ORDER — AZITHROMYCIN 250 MG PO TABS
1000.0000 mg | ORAL_TABLET | Freq: Once | ORAL | Status: AC
  Administered 2019-08-30: 1000 mg via ORAL

## 2019-08-30 NOTE — Discharge Instructions (Signed)

## 2019-08-30 NOTE — ED Triage Notes (Signed)
Pt partner tested + for chlamydia, patient reports "tingling" with urination
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
## 2019-08-31 LAB — CYTOLOGY, (ORAL, ANAL, URETHRAL) ANCILLARY ONLY
Chlamydia: POSITIVE — AB
Comment: NEGATIVE
Comment: NEGATIVE
Comment: NORMAL
Neisseria Gonorrhea: NEGATIVE
Trichomonas: NEGATIVE

## 2019-09-20 ENCOUNTER — Ambulatory Visit (HOSPITAL_COMMUNITY)
Admission: EM | Admit: 2019-09-20 | Discharge: 2019-09-20 | Disposition: A | Discharge status: Home / Self Care | Payer: BLUE CROSS/BLUE SHIELD | Attending: Family Medicine | Admitting: Family Medicine

## 2019-09-20 ENCOUNTER — Other Ambulatory Visit: Payer: Self-pay

## 2019-09-20 ENCOUNTER — Encounter (HOSPITAL_COMMUNITY): Payer: Self-pay | Admitting: Emergency Medicine

## 2019-09-20 DIAGNOSIS — R1084 Generalized abdominal pain: Secondary | ICD-10-CM | POA: Diagnosis not present

## 2019-09-20 MED ORDER — AZITHROMYCIN 250 MG PO TABS
ORAL_TABLET | ORAL | Status: AC
  Filled 2019-09-20: qty 4

## 2019-09-20 MED ORDER — AZITHROMYCIN 250 MG PO TABS
1000.0000 mg | ORAL_TABLET | Freq: Once | ORAL | Status: AC
  Administered 2019-09-20: 1000 mg via ORAL
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
## 2019-09-20 NOTE — ED Triage Notes (Signed)
Pt c/o stomach pain onset yesterday. Denies any N/V/D. He states last bm was last night.

## 2020-08-13 ENCOUNTER — Encounter (HOSPITAL_COMMUNITY): Payer: Self-pay

## 2020-08-13 ENCOUNTER — Other Ambulatory Visit: Payer: Self-pay

## 2020-08-13 ENCOUNTER — Ambulatory Visit (HOSPITAL_COMMUNITY)
Admission: EM | Admit: 2020-08-13 | Discharge: 2020-08-13 | Disposition: A | Discharge status: Home / Self Care | Payer: BLUE CROSS/BLUE SHIELD | Attending: Student | Admitting: Student

## 2020-08-13 DIAGNOSIS — L731 Pseudofolliculitis barbae: Secondary | ICD-10-CM

## 2020-08-13 MED ORDER — DOXYCYCLINE HYCLATE 100 MG PO CAPS: 100.0000 mg | ORAL_CAPSULE | Freq: Two times a day (BID) | ORAL | 0 refills | Status: AC

## 2020-08-13 NOTE — ED Provider Notes (Signed)
[facility]    CSN: [id number] Arrival date & time: 08/13/20  0934      History   Chief Complaint Chief Complaint  Patient presents with   ingrown hair    HPI Jose Boyd is a 45 y.o. male presenting with ingrown hair - beard x3 weeks, with new yellow crusting on top. Medical history noncontributory. Denies fevers/chills, discharge. No history of this in the past.  HPI  History reviewed. No pertinent past medical history.  Patient Active Problem List   Diagnosis Date Noted   SHORTNESS OF BREATH 03/17/2008    Past Surgical History:  Procedure Laterality Date   arm surgery Left    HAND SURGERY Left    HEEL SPUR SURGERY         Home Medications    Prior to Admission medications   Medication Sig Start Date End Date Taking? Authorizing Provider  doxycycline (VIBRAMYCIN) 100 MG capsule Take 1 capsule (100 mg total) by mouth 2 (two) times daily for 7 days. 08/13/20 08/20/20 Yes Rhys Martini, PA-C  losartan (COZAAR) 50 MG tablet Take 1 tablet (50 mg total) by mouth daily. 08/30/19   Wallis Bamberg, PA-C    Family History Family History  Problem Relation Age of Onset   Healthy Mother    Healthy Father     Social History Social History   Tobacco Use   Smoking status: Never   Smokeless tobacco: Never  Substance Use Topics   Alcohol use: No   Drug use: No     Allergies   Patient has no known allergies.   Review of Systems Review of Systems  Skin:        Ingrown hair  All other systems reviewed and are negative.   Physical Exam Triage Vital Signs ED Triage Vitals  Enc Vitals Group     BP 08/13/20 1043 (!) 158/102     Pulse Rate 08/13/20 1041 74     Resp 08/13/20 1041 20     Temp 08/13/20 1041 98.6 F (37 C)     Temp Source 08/13/20 1041 Oral     SpO2 08/13/20 1041 100 %     Weight --      Height --      Head Circumference --      Peak Flow --      Pain Score 08/13/20 1040 0     Pain Loc --      Pain Edu? --      Excl. in GC?  --    No data found.  Updated Vital Signs BP (!) 158/102 (BP Location: Right Arm)   Pulse 74   Temp 98.6 F (37 C) (Oral)   Resp 20   SpO2 100%   Visual Acuity Right Eye Distance:   Left Eye Distance:   Bilateral Distance:    Right Eye Near:   Left Eye Near:    Bilateral Near:     Physical Exam Vitals reviewed.  Constitutional:      General: He is not in acute distress.    Appearance: Normal appearance. He is not ill-appearing or diaphoretic.  HENT:     Head: Normocephalic and atraumatic.  Cardiovascular:     Rate and Rhythm: Normal rate and regular rhythm.     Heart sounds: Normal heart sounds.  Pulmonary:     Effort: Pulmonary effort is normal.     Breath sounds: Normal breath sounds.  Skin:    General: Skin is warm.  Comments: L beard with small 17mm pustule, minimally tender. No erythema or warmth. No discharge  Neurological:     General: No focal deficit present.     Mental Status: He is alert and oriented to person, place, and time.  Psychiatric:        Mood and Affect: Mood normal.        Behavior: Behavior normal.        Thought Content: Thought content normal.        Judgment: Judgment normal.     UC Treatments / Results  Labs (all labs ordered are listed, but only abnormal results are displayed) Labs Reviewed - No data to display  EKG   Radiology No results found.  Procedures Procedures (including critical care time)  Medications Ordered in UC Medications - No data to display  Initial Impression / Assessment and Plan / UC Course  I have reviewed the triage vital signs and the nursing notes.  Pertinent labs & imaging results that were available during my care of the patient were reviewed by me and considered in my medical decision making (see chart for details).     This patient is a very pleasant 45 y.o. year old male presenting with ingrown hair. Afebrile, nontachy.  Discussed that ingrown hair is not currently infected.  Patient  expresses concern that it is progressing into infection, and has a strong preference for treating with antibiotic.  I did send doxycycline as below.  Warm compresses. ED return precautions discussed. Patient verbalizes understanding and agreement.  .   Final Clinical Impressions(s) / UC Diagnoses   Final diagnoses:  Ingrown hair     Discharge Instructions      -Doxycycline twice daily for 7 days.  Make sure to wear sunscreen while spending time outside while on this medication as it can increase your chance of sunburn. You can take this medication with food if you have a sensitive stomach. -Warm compresses to help the hair come out -Come back and see [place] if symptoms worsen/persist     ED Prescriptions     Medication Sig Dispense Auth. Provider   doxycycline (VIBRAMYCIN) 100 MG capsule Take 1 capsule (100 mg total) by mouth 2 (two) times daily for 7 days. 14 capsule Rhys Martini, PA-C      PDMP not reviewed this encounter.   Rhys Martini, PA-C 08/13/20 1249

## 2020-08-13 NOTE — Discharge Instructions (Addendum)
-  Doxycycline twice daily for 7 days.  Make sure to wear sunscreen while spending time outside while on this medication as it can increase your chance of sunburn. You can take this medication with food if you have a sensitive stomach. -Warm compresses to help the hair come out -Come back and see [place] if symptoms worsen/persist

## 2020-08-13 NOTE — ED Triage Notes (Signed)
Pt in with c/o ingrown hair in beard for a few days   Denies any drainage from area

## 2020-10-26 ENCOUNTER — Emergency Department (HOSPITAL_COMMUNITY): Payer: BLUE CROSS/BLUE SHIELD

## 2020-10-26 ENCOUNTER — Encounter (HOSPITAL_COMMUNITY): Payer: Self-pay

## 2020-10-26 ENCOUNTER — Emergency Department (HOSPITAL_COMMUNITY)
Admission: EM | Admit: 2020-10-26 | Discharge: 2020-10-26 | Disposition: A | Discharge status: Home / Self Care | Payer: BLUE CROSS/BLUE SHIELD | Attending: Emergency Medicine | Admitting: Emergency Medicine

## 2020-10-26 ENCOUNTER — Other Ambulatory Visit: Payer: Self-pay

## 2020-10-26 DIAGNOSIS — R109 Unspecified abdominal pain: Secondary | ICD-10-CM | POA: Insufficient documentation

## 2020-10-26 DIAGNOSIS — Z20822 Contact with and (suspected) exposure to covid-19: Secondary | ICD-10-CM | POA: Diagnosis not present

## 2020-10-26 LAB — BASIC METABOLIC PANEL
Anion gap: 12 (ref 5–15)
BUN: 13 mg/dL (ref 6–20)
CO2: 24 mmol/L (ref 22–32)
Calcium: 10.2 mg/dL (ref 8.9–10.3)
Chloride: 104 mmol/L (ref 98–111)
Creatinine, Ser: 1.11 mg/dL (ref 0.61–1.24)
GFR, Estimated: >60 mL/min (ref >60)
Glucose, Bld: 132 mg/dL — ABNORMAL HIGH (ref 70–99)
Potassium: 4 mmol/L (ref 3.5–5.1)
Sodium: 140 mmol/L (ref 135–145)

## 2020-10-26 LAB — URINALYSIS, ROUTINE W REFLEX MICROSCOPIC
Bacteria, UA: NONE SEEN
Bilirubin Urine: NEGATIVE
Glucose, UA: NEGATIVE mg/dL
Hgb urine dipstick: NEGATIVE
Ketones, ur: NEGATIVE mg/dL
Leukocytes,Ua: NEGATIVE
Nitrite: NEGATIVE
Protein, ur: 100 mg/dL — AB
Specific Gravity, Urine: 1.025 (ref 1.005–1.030)
pH: 6 (ref 5.0–8.0)

## 2020-10-26 LAB — CBC WITH DIFFERENTIAL/PLATELET
Abs Immature Granulocytes: 0.05 10*3/uL (ref 0.00–0.07)
Basophils Absolute: 0 10*3/uL (ref 0.0–0.1)
Basophils Relative: 0 %
Eosinophils Absolute: 0 10*3/uL (ref 0.0–0.5)
Eosinophils Relative: 0 %
HCT: 41.2 % (ref 39.0–52.0)
Hemoglobin: 13.8 g/dL (ref 13.0–17.0)
Immature Granulocytes: 1 %
Lymphocytes Relative: 12 %
Lymphs Abs: 1 10*3/uL (ref 0.7–4.0)
MCH: 30.9 pg (ref 26.0–34.0)
MCHC: 33.5 g/dL (ref 30.0–36.0)
MCV: 92.4 fL (ref 80.0–100.0)
Monocytes Absolute: 0.8 10*3/uL (ref 0.1–1.0)
Monocytes Relative: 10 %
Neutro Abs: 6.6 10*3/uL (ref 1.7–7.7)
Neutrophils Relative %: 77 %
Platelets: 240 10*3/uL (ref 150–400)
RBC: 4.46 MIL/uL (ref 4.22–5.81)
RDW: 13.7 % (ref 11.5–15.5)
WBC: 8.5 10*3/uL (ref 4.0–10.5)
nRBC: 0 % (ref 0.0–0.2)

## 2020-10-26 LAB — HEPATIC FUNCTION PANEL
ALT: 18 U/L (ref 0–44)
AST: 17 U/L (ref 15–41)
Albumin: 4.3 g/dL (ref 3.5–5.0)
Alkaline Phosphatase: 63 U/L (ref 38–126)
Bilirubin, Direct: 0.1 mg/dL (ref 0.0–0.2)
Indirect Bilirubin: 1.1 mg/dL — ABNORMAL HIGH (ref 0.3–0.9)
Total Bilirubin: 1.2 mg/dL (ref 0.3–1.2)
Total Protein: 8.2 g/dL — ABNORMAL HIGH (ref 6.5–8.1)

## 2020-10-26 LAB — RESP PANEL BY RT-PCR (FLU A&B, COVID) ARPGX2
Influenza A by PCR: NEGATIVE
Influenza B by PCR: NEGATIVE
SARS Coronavirus 2 by RT PCR: NEGATIVE

## 2020-10-26 MED ORDER — IOHEXOL 350 MG/ML SOLN
80.0000 mL | Freq: Once | INTRAVENOUS | Status: AC | PRN
  Administered 2020-10-26: 80 mL via INTRAVENOUS

## 2020-10-26 MED ORDER — CYCLOBENZAPRINE HCL 10 MG PO TABS: 10.0000 mg | ORAL_TABLET | Freq: Two times a day (BID) | ORAL | 0 refills | Status: DC | PRN

## 2020-10-26 MED ORDER — KETOROLAC TROMETHAMINE 30 MG/ML IJ SOLN
30.0000 mg | Freq: Once | INTRAMUSCULAR | Status: AC
  Administered 2020-10-26: 30 mg via INTRAVENOUS
  Filled 2020-10-26: qty 1
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
## 2020-10-26 NOTE — ED Provider Notes (Signed)
Emergency Medicine Provider Triage Evaluation Note  Jose Boyd , a 45 y.o. male  was evaluated in triage.  Pt complains of right-sided rib pain started 3 days ago, came on suddenly, denies  recent trauma in the area, describes as a sharp sensation he denies any urinary symptoms, denies shortness of breath or chest pain..  Review of Systems  Positive: Right sided rib pain Negative: Nausea, vomiting,  Physical Exam  BP 139/88   Pulse 97   Temp 100.2 F (37.9 C) (Oral)   Resp 18   SpO2 96%  Gen:   Awake, no distress   Resp:  Normal effort  MSK:   Moves extremities without difficulty  Other:    Medical Decision Making  Medically screening exam initiated at 11:10 AM.  Appropriate orders placed.  Jose Boyd was informed that the remainder of the evaluation will be completed by another provider, this initial triage assessment does not replace that evaluation, and the importance of remaining in the ED until their evaluation is complete.  Presents with right-sided pain, lab work imaging been ordered, patient need further work-up.   Carroll Sage, PA-C 10/26/20 1111    Linwood Dibbles, MD 10/28/20 [phone]

## 2020-10-26 NOTE — ED Triage Notes (Signed)
Pt arrived via POV, right sided flank pain, radiating to back. Denies n/v, diarrhea or urinary sx. Also c/o right foot pain x1 year

## 2020-10-28 LAB — URINE CULTURE: Culture: <10000 — AB

## 2020-10-29 ENCOUNTER — Ambulatory Visit: Payer: Self-pay | Admitting: *Deleted

## 2020-10-29 NOTE — Telephone Encounter (Signed)
C/o right sided flank pain. Last seen in ED 10/26/20. Patient reports flexeril 10 mg not working to manage pain in right side. C/o pain with certain movements. Denies constant pain, vomiting, weakness , no fever or pain urinating. C/o stain with movement. Encouraged patient to take flexeril as directed 2 times a day and can apply warm compress to right side for 20 minutes during the day. Walk around house atleast every 2 hours. Patient has no PCP. Patient initially refused to set up appt for PCP but is willing to set up appt . Care advise given. Patient verbalized understanding of care advise and to call back or go to [facility] or ED if symptoms worsen. Agent to contact patient to set up PCP.

## 2020-10-29 NOTE — Telephone Encounter (Signed)
Reason for Disposition  MODERATE pain (e.g., interferes with normal activities or awakens from sleep)  Answer Assessment - Initial Assessment Questions 1. LOCATION: "Where does it hurt?" (e.g., left, right)     Right side  2. ONSET: "When did the pain start?"     Ongoing since last week  3. SEVERITY: "How bad is the pain?" (e.g., Scale 1-10; mild, moderate, or severe)   - MILD (1-3): doesn't interfere with normal activities    - MODERATE (4-7): interferes with normal activities or awakens from sleep    - SEVERE (8-10): excruciating pain and patient unable to do normal activities (stays in bed)       When moving certain ways  4. PATTERN: "Does the pain come and go, or is it constant?"      Comes and goes with certain movements  5. CAUSE: "What do you think is causing the pain?"     Not sure was seen in ED 10/26/20 6. OTHER SYMPTOMS:  "Do you have any other symptoms?" (e.g., fever, abdominal pain, vomiting, leg weakness, burning with urination, blood in urine)     Denies  7. PREGNANCY:  "Is there any chance you are pregnant?" "When was your last menstrual period?"     na  Protocols used: Flank Pain-A-AH

## 2020-12-11 ENCOUNTER — Ambulatory Visit (HOSPITAL_BASED_OUTPATIENT_CLINIC_OR_DEPARTMENT_OTHER): Payer: BLUE CROSS/BLUE SHIELD | Admitting: Nurse Practitioner

## 2021-01-07 ENCOUNTER — Encounter (HOSPITAL_BASED_OUTPATIENT_CLINIC_OR_DEPARTMENT_OTHER): Payer: Self-pay | Admitting: Nurse Practitioner

## 2021-03-01 ENCOUNTER — Ambulatory Visit (HOSPITAL_COMMUNITY)
Admission: EM | Admit: 2021-03-01 | Discharge: 2021-03-01 | Disposition: A | Discharge status: Home / Self Care | Payer: BLUE CROSS/BLUE SHIELD | Attending: Family Medicine | Admitting: Family Medicine

## 2021-03-01 ENCOUNTER — Encounter (HOSPITAL_COMMUNITY): Payer: Self-pay

## 2021-03-01 ENCOUNTER — Other Ambulatory Visit: Payer: Self-pay

## 2021-03-01 DIAGNOSIS — R42 Dizziness and giddiness: Secondary | ICD-10-CM | POA: Diagnosis not present

## 2021-03-01 DIAGNOSIS — I10 Essential (primary) hypertension: Secondary | ICD-10-CM

## 2021-03-01 NOTE — ED Provider Notes (Signed)
[facility]    CSN: [id number] Arrival date & time: 03/01/21  1430      History   Chief Complaint Chief Complaint  Patient presents with   Dizziness    HPI Jose Boyd is a 46 y.o. male.   Patient is here for dizziness.  Yesterday he got up from the bed, went to the bathroom, and felt dizzy.  His head was spinning.  He went to the door, and his head continued to feel dizzy, spinning.  He did have n/v with this x 3.  EMS did come to the house yesterday, his bp was elevated 170/118.  His dizziness was better last night, and his bp did come down.  Then today he went outside, started raking leaves, and started with the spinning sensation, not as bad as yesterday.  He was given bp medication, but does not use this every day.  He did take it this morning, however.  No chest pain or sob.   History reviewed. No pertinent past medical history.  Patient Active Problem List   Diagnosis Date Noted   SHORTNESS OF BREATH 03/17/2008    Past Surgical History:  Procedure Laterality Date   arm surgery Left    HAND SURGERY Left    HEEL SPUR SURGERY         Home Medications    Prior to Admission medications   Medication Sig Start Date End Date Taking? Authorizing Provider  cyclobenzaprine (FLEXERIL) 10 MG tablet Take 1 tablet (10 mg total) by mouth 2 (two) times daily as needed for muscle spasms. Patient not taking: Reported on 03/01/2021 10/26/20   Gareth Morgan, MD  losartan (COZAAR) 50 MG tablet Take 1 tablet (50 mg total) by mouth daily. 08/30/19   Jaynee Eagles, PA-C    Family History Family History  Problem Relation Age of Onset   Healthy Mother    Healthy Father     Social History Social History   Tobacco Use   Smoking status: Never   Smokeless tobacco: Never  Substance Use Topics   Alcohol use: No   Drug use: No     Allergies   Patient has no known allergies.   Review of Systems Review of Systems  Constitutional: Negative.   HENT: Negative.     Respiratory: Negative.    Cardiovascular: Negative.   Gastrointestinal:  Positive for nausea and vomiting.  Neurological:  Positive for dizziness.    Physical Exam Triage Vital Signs ED Triage Vitals  Enc Vitals Group     BP 03/01/21 1443 (!) 154/100     Pulse Rate 03/01/21 1443 78     Resp 03/01/21 1443 14     Temp 03/01/21 1443 97.9 F (36.6 C)     Temp Source 03/01/21 1443 Oral     SpO2 03/01/21 1443 98 %     Weight 03/01/21 1445 215 lb (97.5 kg)     Height --      Head Circumference --      Peak Flow --      Pain Score 03/01/21 1445 0     Pain Loc --      Pain Edu? --      Excl. in [place]? --    No data found.  Updated Vital Signs BP (!) 154/100 (BP Location: Right Arm)    Pulse 78    Temp 97.9 F (36.6 C) (Oral)    Resp 14    Wt 97.5 kg    SpO2 98%  BMI 29.99 kg/m   Visual Acuity Right Eye Distance:   Left Eye Distance:   Bilateral Distance:    Right Eye Near:   Left Eye Near:    Bilateral Near:     Physical Exam Constitutional:      Appearance: Normal appearance.  HENT:     Head: Normocephalic and atraumatic.     Right Ear: Tympanic membrane normal.     Left Ear: Tympanic membrane normal.     Nose: Nose normal.  Eyes:     Pupils: Pupils are equal, round, and reactive to light.  Cardiovascular:     Rate and Rhythm: Normal rate and regular rhythm.  Pulmonary:     Effort: Pulmonary effort is normal.     Breath sounds: Normal breath sounds.  Musculoskeletal:     Cervical back: Normal range of motion.  Neurological:     General: No focal deficit present.     Mental Status: He is alert.     Motor: No weakness.     UC Treatments / Results  Labs (all labs ordered are listed, but only abnormal results are displayed) Labs Reviewed - No data to display  EKG   Radiology No results found.  Procedures Procedures (including critical care time)  Medications Ordered in UC Medications - No data to display  Initial Impression / Assessment and  Plan / UC Course  I have reviewed the triage vital signs and the nursing notes.  Pertinent labs & imaging results that were available during my care of the patient were reviewed by me and considered in my medical decision making (see chart for details).  Patient was seen today for dizziness and elevated blood pressure.  I think his dizziness is related to BPPV.  Discussion about this today.  At this time he needs to monitor his symptoms, and get plenty of fluids.  Although his bp is elevated, I do not think that is necessarily related.  I do want him to take his BP medication daily, and follow up with a primary care provider for further care.   Final Clinical Impressions(s) / UC Diagnoses   Final diagnoses:  Vertigo  Hypertension, unspecified type     Discharge Instructions      You were seen today for dizziness and elevated blood pressure.  Your dizziness is likely due to vertigo.  I have given you information on this today.  This will take time to resolve on its own.  Your blood pressure is also elevated.  Please take your blood pressure medication daily as directed.  Please call to make an appointment with a primary care provider.  You  may call [phone] to make an appointment.     ED Prescriptions   None    PDMP not reviewed this encounter.   Rondel Oh, MD 03/01/21 [phone]

## 2021-03-01 NOTE — Discharge Instructions (Addendum)
You were seen today for dizziness and elevated blood pressure.  Your dizziness is likely due to vertigo.  I have given you information on this today.  This will take time to resolve on its own.  Your blood pressure is also elevated.  Please take your blood pressure medication daily as directed.  Please call to make an appointment with a primary care provider.  You  may call [phone] to make an appointment.

## 2021-03-01 NOTE — ED Triage Notes (Signed)
Pt presents with dizziness that began yesterday. Pt states he was evaluated by EMS yesterday. Pt states he felt the dizziness again today after raking his yard, pt states BP was elevated

## 2021-03-04 ENCOUNTER — Telehealth: Payer: Self-pay

## 2021-03-04 NOTE — Telephone Encounter (Signed)
Pt called stated he was given our number by Urgent care to do a f/u from is visit and to get a PCP .Marland Kitchen Was given 2/1 @ 8:45 with Dr Ned Card

## 2021-03-06 ENCOUNTER — Encounter: Payer: Self-pay | Admitting: Internal Medicine

## 2021-03-06 ENCOUNTER — Ambulatory Visit (INDEPENDENT_AMBULATORY_CARE_PROVIDER_SITE_OTHER): Payer: BLUE CROSS/BLUE SHIELD | Admitting: Internal Medicine

## 2021-03-06 VITALS — BP 149/100 | HR 75 | Temp 97.9°F | Ht 71.0 in | Wt 209.2 lb

## 2021-03-06 DIAGNOSIS — H8110 Benign paroxysmal vertigo, unspecified ear: Secondary | ICD-10-CM

## 2021-03-06 DIAGNOSIS — I10 Essential (primary) hypertension: Secondary | ICD-10-CM

## 2021-03-06 DIAGNOSIS — Z0000 Encounter for general adult medical examination without abnormal findings: Secondary | ICD-10-CM

## 2021-03-06 NOTE — Progress Notes (Signed)
Internal Medicine Clinic Attending ° °Case discussed with Dr. Atway  At the time of the visit.  We reviewed the resident’s history and exam and pertinent patient test results.  I agree with the assessment, diagnosis, and plan of care documented in the resident’s note.  °

## 2021-03-06 NOTE — Progress Notes (Signed)
° °  CC: dizziness, establish care  HPI:  [name] is a 46 y.o. male with no significant past medical history who presents to the [facility] to establish care.   No past medical history on file. Review of Systems:   Review of Systems  Constitutional:  Negative for chills and fever.  HENT:  Negative for hearing loss and tinnitus.   Eyes:  Negative for blurred vision.  Respiratory:  Negative for shortness of breath.   Cardiovascular:  Negative for chest pain and palpitations.  Gastrointestinal:  Negative for nausea and vomiting.  Genitourinary: Negative.   Musculoskeletal: Negative.   Neurological:  Positive for dizziness. Negative for headaches.  Psychiatric/Behavioral: Negative.      Family History: Unknown to patient  Social History: Smokes cigars frequently. Occasional alcohol use. No other illicit substance use.   Physical Exam:  Vitals:   03/06/21 0919  BP: (!) 158/100  Pulse: 78  Temp: 97.9 F (36.6 C)  TempSrc: Oral  SpO2: 99%  Weight: 209 lb 3.2 oz (94.9 kg)  Height: 5\' 11"  (1.803 m)   General: Pleasant, well-appearing male. No acute distress. CV: RRR. No murmurs. No LE edema Pulmonary: Lungs CTAB. Normal effort.  Abdominal: Soft, nontender, nondistended.  Extremities: Palpable radial and DP pulses. Normal ROM. Skin: Warm and dry.  Neuro: A&Ox3. No focal deficit. Psych: Normal mood and affect   Assessment & Plan:   See Encounters Tab for problem based charting.  Patient discussed with Dr. Heber Boyd

## 2021-03-06 NOTE — Patient Instructions (Signed)
Thank you, [name] for allowing [place] to provide your care today. Today we discussed:  High blood pressure: Please continue taking losartan 50 mg everyday. We are checking some lab work today and I will call you with any abnormal results. Additionally, please check your blood pressure once a day and bring in those numbers at your follow up appt in 3 weeks with me, to recheck your blood pressure.  Healthcare maintenance: Please consider getting a colonoscopy to screen for colon cancer. This is something we recommend starting at age 52    I have ordered the following labs for you:   Lab Orders         BMP8+Anion Gap         Lipid Profile         CBC no Diff         Hepatitis C Ab reflex to Quant PCR         Hemoglobin A1c      Tests ordered today:  none  Referrals ordered today:   Referral Orders  No referral(s) requested today     I have ordered the following medication/changed the following medications:   Stop the following medications: There are no discontinued medications.   Start the following medications: No orders of the defined types were placed in this encounter.    Follow up:  3 weeks for BP check     Remember: To take your losartan 50 mg everyday  Should you have any questions or concerns please call the internal medicine clinic at [phone].     Jose Boyd, D.O. [facility]

## 2021-03-06 NOTE — Assessment & Plan Note (Signed)
BP Readings from Last 3 Encounters:  03/06/21 (!) 149/100  03/01/21 (!) 154/100  10/26/20 (!) 141/81   BP elevated to 149/100- initially was 158/100 [facility]. Patient notes that he has been taking losartan 50 mg daily for the past week (was given this rx from an urgent care). He checks in BP at home, and states that his BP is usually not this high and has noted readings of SBP in the 130s. Advised patient to continue taking the losartan everyday, and we will follow up closely in ~3 weeks to recheck BP. Patient also has been noted to have aortic atherosclerosis on a previous CT, so it is especially important to keep his BP well controlled.   Plan: - Losartan 50 mg daily - Recheck BP in ~3 weeks - BMP, CBC, lipid profile, and A1c checked today - Consider urine protein/cr ratio at next visit

## 2021-03-07 LAB — BMP8+ANION GAP
Anion Gap: 19 mmol/L — ABNORMAL HIGH (ref 10.0–18.0)
BUN/Creatinine Ratio: 15 (ref 9–20)
BUN: 14 mg/dL (ref 6–24)
CO2: 21 mmol/L (ref 20–29)
Calcium: 10 mg/dL (ref 8.7–10.2)
Chloride: 102 mmol/L (ref 96–106)
Creatinine, Ser: 0.93 mg/dL (ref 0.76–1.27)
Glucose: 100 mg/dL — ABNORMAL HIGH (ref 70–99)
Potassium: 4.1 mmol/L (ref 3.5–5.2)
Sodium: 142 mmol/L (ref 134–144)
eGFR: 103 mL/min/{1.73_m2} (ref >59)

## 2021-03-07 LAB — CBC
Hematocrit: 43.9 % (ref 37.5–51.0)
Hemoglobin: 14.7 g/dL (ref 13.0–17.7)
MCH: 30.4 pg (ref 26.6–33.0)
MCHC: 33.5 g/dL (ref 31.5–35.7)
MCV: 91 fL (ref 79–97)
Platelets: 218 10*3/uL (ref 150–450)
RBC: 4.84 x10E6/uL (ref 4.14–5.80)
RDW: 13.9 % (ref 11.6–15.4)
WBC: 4.5 10*3/uL (ref 3.4–10.8)

## 2021-03-07 LAB — LIPID PANEL
Chol/HDL Ratio: 4.9 ratio (ref 0.0–5.0)
Cholesterol, Total: 291 mg/dL — ABNORMAL HIGH (ref 100–199)
HDL: 59 mg/dL (ref >39)
LDL Chol Calc (NIH): 189 mg/dL — ABNORMAL HIGH (ref 0–99)
Triglycerides: 228 mg/dL — ABNORMAL HIGH (ref 0–149)
VLDL Cholesterol Cal: 43 mg/dL — ABNORMAL HIGH (ref 5–40)

## 2021-03-07 LAB — HCV AB W REFLEX TO QUANT PCR: HCV Ab: <0.1 s/co ratio (ref 0.0–0.9)

## 2021-03-07 LAB — HEMOGLOBIN A1C
Est. average glucose Bld gHb Est-mCnc: 114 mg/dL
Hgb A1c MFr Bld: 5.6 % (ref 4.8–5.6)

## 2021-03-07 LAB — HCV INTERPRETATION

## 2021-03-27 ENCOUNTER — Encounter: Payer: BLUE CROSS/BLUE SHIELD | Admitting: Internal Medicine

## 2021-04-05 ENCOUNTER — Encounter: Payer: BLUE CROSS/BLUE SHIELD | Admitting: Internal Medicine

## 2021-04-05 ENCOUNTER — Encounter: Payer: Self-pay | Admitting: Internal Medicine

## 2021-09-24 ENCOUNTER — Encounter: Payer: BLUE CROSS/BLUE SHIELD | Admitting: Internal Medicine

## 2021-09-25 ENCOUNTER — Encounter: Payer: Self-pay | Admitting: Internal Medicine

## 2022-01-08 ENCOUNTER — Encounter: Payer: Self-pay | Admitting: Internal Medicine

## 2022-03-10 ENCOUNTER — Other Ambulatory Visit: Payer: Self-pay

## 2022-03-10 ENCOUNTER — Emergency Department (HOSPITAL_COMMUNITY)
Admission: EM | Admit: 2022-03-10 | Discharge: 2022-03-10 | Disposition: A | Discharge status: Home / Self Care | Payer: BLUE CROSS/BLUE SHIELD | Attending: Emergency Medicine | Admitting: Emergency Medicine

## 2022-03-10 ENCOUNTER — Emergency Department (HOSPITAL_COMMUNITY): Payer: BLUE CROSS/BLUE SHIELD

## 2022-03-10 DIAGNOSIS — R059 Cough, unspecified: Secondary | ICD-10-CM | POA: Diagnosis present

## 2022-03-10 DIAGNOSIS — R0981 Nasal congestion: Secondary | ICD-10-CM | POA: Diagnosis not present

## 2022-03-10 DIAGNOSIS — I10 Essential (primary) hypertension: Secondary | ICD-10-CM | POA: Insufficient documentation

## 2022-03-10 DIAGNOSIS — R0989 Other specified symptoms and signs involving the circulatory and respiratory systems: Secondary | ICD-10-CM | POA: Insufficient documentation

## 2022-03-10 DIAGNOSIS — R052 Subacute cough: Secondary | ICD-10-CM | POA: Diagnosis not present

## 2022-03-10 DIAGNOSIS — Z79899 Other long term (current) drug therapy: Secondary | ICD-10-CM | POA: Insufficient documentation

## 2022-03-10 DIAGNOSIS — Z20822 Contact with and (suspected) exposure to covid-19: Secondary | ICD-10-CM | POA: Insufficient documentation

## 2022-03-10 LAB — RESP PANEL BY RT-PCR (RSV, FLU A&B, COVID)  RVPGX2
Influenza A by PCR: NEGATIVE
Influenza B by PCR: NEGATIVE
Resp Syncytial Virus by PCR: NEGATIVE
SARS Coronavirus 2 by RT PCR: NEGATIVE

## 2022-03-10 MED ORDER — FLUTICASONE PROPIONATE 50 MCG/ACT NA SUSP: 1.0000 | Freq: Every day | NASAL | 1 refills | Status: DC

## 2022-03-10 MED ORDER — CETIRIZINE HCL 10 MG PO TABS: 10.0000 mg | ORAL_TABLET | Freq: Every day | ORAL | 0 refills | Status: DC

## 2022-03-10 NOTE — Discharge Instructions (Signed)
You were seen in the ED today for your cough, while in the ED we completed a full history and physical exam. Based off of our findings we believe you are suffering from allergy induced cough. Please begin taking flonase 1 spray per nostril, and then begin taking zyrtec 1 tablet daily to improve your symptoms.  Sincerely, Clydie Braun, PGY-2

## 2022-03-10 NOTE — ED Provider Triage Note (Signed)
Emergency Medicine Provider Triage Evaluation Note  ALEXXANDER KURT , a 47 y.o. male  was evaluated in triage.  Pt complains of frequent dry cough for the last week.  Patient states that he had this chronically up until a few years ago and he always attributed it to his weight, however, a similar cough returned a week ago and he is unsure what is causing it today.  He denies any known sick contacts.  He denies any fever, chills, congestion, sore throat, nausea or vomiting, chest pain or shortness of breath.  He is a regular cigar smoker and sometimes smokes once every night but otherwise does not use any other tobacco or other smoke inhalation products.  States he has no history of asthma or COPD.  States that he became concerned today because he was at a restaurant trying to eat when he had a significant coughing spell and then began to feel like he was choking so he came to the ED for evaluation.  He states he has not taken any over-the-counter medications to treat his cough as they all stated on the label not to take if you have a history of a chronic cough and he was unsure if he should take them due to his previous history of having symptoms chronically.  Review of Systems  Positive: See HPI Negative: See HPI  Physical Exam  BP (!) 137/91   Pulse (!) 52   Temp 98.9 F (37.2 C) (Oral)   Resp 20   Wt 111.1 kg   SpO2 98%   BMI 34.17 kg/m  Gen:   Awake, no distress   Resp:  Normal effort lungs clear to auscultation MSK:   Moves extremities without difficulty no lower extremity edema Other:  Regular rate and rhythm, frequent dry cough, oropharynx is without erythema, exudates, or swelling, no cervical lymphadenopathy  Medical Decision Making  Medically screening exam initiated at 6:07 PM.  Appropriate orders placed.  Mizraim JERL MUNYAN was informed that the remainder of the evaluation will be completed by another provider, this initial triage assessment does not replace that evaluation, and the  importance of remaining in the ED until their evaluation is complete.     Suzzette Righter, PA-C 03/10/22 2878

## 2022-03-10 NOTE — ED Notes (Signed)
Patient verbalizes understanding of d/c instructions. Opportunities for questions and answers were provided. Pt d/c from ED and ambulated to lobby.

## 2022-03-10 NOTE — ED Provider Notes (Signed)
  [name] Provider Note   CSN: [id number] Arrival date & time: 03/10/22  1635     History {Add pertinent medical, surgical, social history, OB history to HPI:1} Chief Complaint  Patient presents with   Cough    Jose Boyd is a 47 y.o. male.   Cough      Home Medications Prior to Admission medications   Medication Sig Start Date End Date Taking? Authorizing Provider  cyclobenzaprine (FLEXERIL) 10 MG tablet Take 1 tablet (10 mg total) by mouth 2 (two) times daily as needed for muscle spasms. Patient not taking: Reported on 03/01/2021 10/26/20   Gareth Morgan, MD  losartan (COZAAR) 50 MG tablet Take 1 tablet (50 mg total) by mouth daily. 08/30/19   Jaynee Eagles, PA-C      Allergies    Patient has no known allergies.    Review of Systems   Review of Systems  Respiratory:  Positive for cough.     Physical Exam Updated Vital Signs BP (!) 137/91   Pulse (!) 52   Temp 98.9 F (37.2 C) (Oral)   Resp 20   Wt 111.1 kg   SpO2 98%   BMI 34.17 kg/m  Physical Exam  ED Results / Procedures / Treatments   Labs (all labs ordered are listed, but only abnormal results are displayed) Labs Reviewed  RESP PANEL BY RT-PCR (RSV, FLU A&B, COVID)  [id number]    EKG None  Radiology DG Chest 2 View  Result Date: 03/10/2022 CLINICAL DATA:  Cough, shortness of breath. EXAM: CHEST - 2 VIEW COMPARISON:  October 26, 2020. FINDINGS: The heart size and mediastinal contours are within normal limits. Both lungs are clear. The visualized skeletal structures are unremarkable. IMPRESSION: No active cardiopulmonary disease. Electronically Signed   By: Marijo Conception M.D.   On: 03/10/2022 18:42    Procedures Procedures  {Document cardiac monitor, telemetry assessment procedure when appropriate:1}  Medications Ordered in ED Medications - No data to display  ED Course/ Medical Decision Making/ A&P   {   Click here for ABCD2, HEART and  other calculatorsREFRESH Note before signing :1}                          Medical Decision Making  ***  {Document critical care time when appropriate:1} {Document review of labs and clinical decision tools ie heart score, Chads2Vasc2 etc:1}  {Document your independent review of radiology images, and any outside records:1} {Document your discussion with family members, caretakers, and with consultants:1} {Document social determinants of health affecting pt's care:1} {Document your decision making why or why not admission, treatments were needed:1} Final Clinical Impression(s) / ED Diagnoses Final diagnoses:  None    Rx / DC Orders ED Discharge Orders     None

## 2022-03-10 NOTE — ED Triage Notes (Signed)
Pt arrives with c/o cough that has been going on for about a week. Pt endorses dry cough. Pt denies fevers or congestion.

## 2022-03-11 ENCOUNTER — Ambulatory Visit (HOSPITAL_COMMUNITY): Payer: BLUE CROSS/BLUE SHIELD

## 2022-12-01 ENCOUNTER — Encounter (HOSPITAL_COMMUNITY): Payer: Self-pay | Admitting: Emergency Medicine

## 2022-12-01 ENCOUNTER — Ambulatory Visit (HOSPITAL_COMMUNITY)
Admission: EM | Admit: 2022-12-01 | Discharge: 2022-12-01 | Disposition: A | Discharge status: Home / Self Care | Payer: BLUE CROSS/BLUE SHIELD | Attending: Emergency Medicine | Admitting: Emergency Medicine

## 2022-12-01 DIAGNOSIS — I10 Essential (primary) hypertension: Secondary | ICD-10-CM | POA: Diagnosis not present

## 2022-12-01 DIAGNOSIS — H1032 Unspecified acute conjunctivitis, left eye: Secondary | ICD-10-CM

## 2022-12-01 MED ORDER — MOXIFLOXACIN HCL 0.5 % OP SOLN: 1.0000 [drp] | Freq: Three times a day (TID) | OPHTHALMIC | 0 refills | Status: DC

## 2022-12-01 NOTE — ED Triage Notes (Addendum)
Pt c/o left eye irritation and swelling that started yesterday. Pt denies any known injury   PT sounds congested but states he does not have any other symptoms

## 2022-12-01 NOTE — Discharge Instructions (Addendum)
Use the eye drops in the left eye, 3 times daily for the next 6-7 days Warm compress for 10-15 minutes, several times daily   You can scan the QR code on the last page to get established with a primary care provider. Please do this as soon as possible to discuss your high blood pressure.

## 2022-12-01 NOTE — ED Provider Notes (Signed)
[facility]    CSN: [id number] Arrival date & time: 12/01/22  1424     History   Chief Complaint Chief Complaint  Patient presents with   Eye Problem    HPI Jose Boyd is a 47 y.o. male.  Here with some mucous discharge from the left eye Started yesterday. A little swelling of lower lid. Not itchy or painful No injury or trauma  Not having eye pain. No fever or vision changes Did a warm compress that helped  No known sick contacts   Denies history of HTN. However it is documented in chart in 2023. Used to take losartan. Does not have a PCP   History reviewed. No pertinent past medical history.  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 Authorizing Provider  moxifloxacin (VIGAMOX) 0.5 % ophthalmic solution Place 1 drop into both eyes 3 (three) times daily. 12/01/22  Yes Edrick Whitehorn, PA-C  fluticasone (FLONASE) 50 MCG/ACT nasal spray Place 1 spray into both nostrils daily. 03/10/22   Alphia Kava, MD    Family History Family History  Problem Relation Age of Onset   Healthy Mother    Healthy Father     Social History Social History   Tobacco Use   Smoking status: Never   Smokeless tobacco: Never  Substance Use Topics   Alcohol use: No   Drug use: No     Allergies   Patient has no known allergies.   Review of Systems Review of Systems  Per HPI  Physical Exam Triage Vital Signs ED Triage Vitals  Encounter Vitals Group     BP 12/01/22 1520 (!) 155/99     Systolic BP Percentile --      Diastolic BP Percentile --      Pulse Rate 12/01/22 1520 88     Resp 12/01/22 1520 18     Temp  12/01/22 1520 98.5 F (36.9 C)     Temp Source 12/01/22 1520 Oral     SpO2 12/01/22 1520 97 %     Weight --      Height --      Head Circumference --      Peak Flow --      Pain Score 12/01/22 1519 4     Pain Loc --      Pain Education --      Exclude from Growth Chart --    No data found.  Updated Vital Signs BP (!) 153/101 (BP Location: Right Arm)   Pulse 88   Temp 98.5 F (36.9 C) (Oral)   Resp 18   SpO2 97%   Physical Exam Vitals and nursing note reviewed.  Constitutional:      General: He is not in acute distress.    Appearance: Normal appearance.  HENT:     Nose: No rhinorrhea.     Mouth/Throat:     Mouth: Mucous membranes are moist.     Pharynx: Oropharynx is clear.  Eyes:     General:        Left eye: Discharge present.    Extraocular Movements: Extraocular  movements intact.     Conjunctiva/sclera: Conjunctivae normal.     Pupils: Pupils are equal, round, and reactive to light.     Comments: Mild swelling left lower lid. No erythema. Non tender. No pain with EOM. No conjunctival injection. There is some green/yellow discharge inner eye  Cardiovascular:     Rate and Rhythm: Normal rate and regular rhythm.     Heart sounds: Normal heart sounds.  Pulmonary:     Effort: Pulmonary effort is normal.     Breath sounds: Normal breath sounds.  Musculoskeletal:     Cervical back: Normal range of motion.  Skin:    General: Skin is warm and dry.  Neurological:     General: No focal deficit present.     Mental Status: He is alert and oriented to person, place, and time.     Cranial Nerves: No cranial nerve deficit.     Motor: No weakness.     UC Treatments / Results  Labs (all labs ordered are listed, but only abnormal results are displayed) Labs Reviewed - No data to display  EKG  Radiology No results found.  Procedures Procedures (including critical care time)  Medications Ordered in UC Medications - No data to display  Initial Impression /  Assessment and Plan / UC Course  I have reviewed the triage vital signs and the nursing notes.  Pertinent labs & imaging results that were available during my care of the patient were reviewed by me and considered in my medical decision making (see chart for details).  Conjunctivitis Bacterial given mucous discharge. Vigamox TID. Warm compress for lid swelling. Low concern for preseptal cellulitis at this time. Discussed reasons to return  HTN Elevated 153/101. No symptoms. Offered to set up with PCP but patient has busy schedule. Advised scan QR code to choose date and time that works best for him. Discussed importance of follow up.  Patient is agreeable to plan. No questions   Final Clinical Impressions(s) / UC Diagnoses   Final diagnoses:  Acute bacterial conjunctivitis of left eye  Hypertension, unspecified type     Discharge Instructions      Use the eye drops in the left eye, 3 times daily for the next 6-7 days Warm compress for 10-15 minutes, several times daily   You can scan the QR code on the last page to get established with a primary care provider. Please do this as soon as possible to discuss your high blood pressure.      ED Prescriptions     Medication Sig Dispense Auth. Provider   moxifloxacin (VIGAMOX) 0.5 % ophthalmic solution Place 1 drop into both eyes 3 (three) times daily. 3 mL Oberon Hehir, Lurena Joiner, PA-C      PDMP not reviewed this encounter.   Moncerrat Burnstein, Lurena Joiner, [place] 12/01/22 1601

## 2022-12-10 ENCOUNTER — Ambulatory Visit (HOSPITAL_COMMUNITY)
Admission: EM | Admit: 2022-12-10 | Discharge: 2022-12-10 | Disposition: A | Discharge status: Home / Self Care | Payer: BLUE CROSS/BLUE SHIELD | Attending: Emergency Medicine | Admitting: Emergency Medicine

## 2022-12-10 ENCOUNTER — Encounter (HOSPITAL_COMMUNITY): Payer: Self-pay | Admitting: Emergency Medicine

## 2022-12-10 ENCOUNTER — Other Ambulatory Visit: Payer: Self-pay

## 2022-12-10 DIAGNOSIS — L03213 Periorbital cellulitis: Secondary | ICD-10-CM

## 2022-12-10 DIAGNOSIS — H1032 Unspecified acute conjunctivitis, left eye: Secondary | ICD-10-CM

## 2022-12-10 MED ORDER — POLYMYXIN B-TRIMETHOPRIM 10000-0.1 UNIT/ML-% OP SOLN: 1.0000 [drp] | Freq: Three times a day (TID) | OPHTHALMIC | 0 refills | Status: AC | PRN

## 2022-12-10 MED ORDER — DOXYCYCLINE HYCLATE 100 MG PO CAPS: 100.0000 mg | ORAL_CAPSULE | Freq: Two times a day (BID) | ORAL | 0 refills | Status: AC

## 2022-12-10 NOTE — Discharge Instructions (Addendum)
Please take medication as prescribed. Take with food to avoid upset stomach. Finish all 7 days!  Please also use the new eye drops. 1 drop three times daily for the next 5-6 days  If no improvement by Saturday, please return Please go to the emergency department if symptoms worsen.

## 2022-12-10 NOTE — ED Provider Notes (Signed)
[facility]    CSN: [id number] Arrival date & time: 12/10/22  1131      History   Chief Complaint Chief Complaint  Patient presents with   Eye Problem    HPI Jose Boyd is a 47 y.o. male.  Here with a few day history of left upper eyelid swelling and some mucous discharge  Seen on 10/28 by this provider for left lower eyelid swelling, discharge. Was prescribed Vigamox. Symptoms had fully resolved until a few days ago. He is not having fever. No eye pain with movement. Not having vision changes.   History reviewed. No pertinent past medical history.  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 Authorizing Provider  doxycycline (VIBRAMYCIN) 100 MG capsule Take 1 capsule (100 mg total) by mouth 2 (two) times daily for 7 days. 12/10/22 12/17/22 Yes Atlas Crossland, Lurena Joiner, PA-C  trimethoprim-polymyxin b (POLYTRIM) ophthalmic solution Place 1 drop into the left eye 3 (three) times daily as needed. 12/10/22  Yes Zaleah Ternes, Lurena Joiner, PA-C    Family History Family History  Problem Relation Age of Onset   Healthy Mother    Healthy Father     Social History Social History   Tobacco Use   Smoking status: Never   Smokeless tobacco: Never  Vaping Use   Vaping status: Never Used  Substance Use Topics   Alcohol use: Yes   Drug use: No     Allergies   Patient has no known allergies.   Review of Systems Review of Systems   Physical Exam Triage Vital Signs ED Triage Vitals  Encounter Vitals Group     BP 12/10/22 1240 (!) 167/100     Systolic BP Percentile --      Diastolic BP Percentile --      Pulse Rate  12/10/22 1240 81     Resp 12/10/22 1240 18     Temp 12/10/22 1240 97.8 F (36.6 C)     Temp Source 12/10/22 1240 Oral     SpO2 12/10/22 1240 94 %     Weight --      Height --      Head Circumference --      Peak Flow --      Pain Score 12/10/22 1237 4     Pain Loc --      Pain Education --      Exclude from Growth Chart --    No data found.  Updated Vital Signs BP (!) 167/100 (BP Location: Right Arm) Comment (BP Location): large cuff  Pulse 81   Temp 97.8 F (36.6 C) (Oral)   Resp 18   SpO2 94%   Visual Acuity Right Eye Distance:   Left Eye Distance:   Bilateral Distance:    Right Eye Near:   Left Eye Near:    Bilateral Near:     Physical Exam Vitals and nursing note reviewed.  Constitutional:      General: He is not in acute distress.    Appearance: Normal appearance.  HENT:     Mouth/Throat:     Mouth:  Mucous membranes are moist.     Pharynx: Oropharynx is clear.  Eyes:     General:        Right eye: No discharge.        Left eye: Discharge present.    Extraocular Movements: Extraocular movements intact.     Conjunctiva/sclera:     Left eye: Left conjunctiva is not injected.     Pupils: Pupils are equal, round, and reactive to light.     Comments: Swelling of left upper eyelid. There is mucous discharge in the left eye. No pain with EOM, no proptosis. Vision intact. No pain with palpation of periorbital spaces.  Cardiovascular:     Rate and Rhythm: Normal rate and regular rhythm.     Heart sounds: Normal heart sounds.  Pulmonary:     Effort: Pulmonary effort is normal.     Breath sounds: Normal breath sounds.  Musculoskeletal:     Cervical back: Normal range of motion.  Skin:    General: Skin is warm and dry.  Neurological:     General: No focal deficit present.     Mental Status: He is alert and oriented to person, place, and time.      UC Treatments / Results  Labs (all labs ordered are listed, but only abnormal results are displayed) Labs  Reviewed - No data to display  EKG   Radiology No results found.  Procedures Procedures (including critical care time)  Medications Ordered in UC Medications - No data to display  Initial Impression / Assessment and Plan / UC Course  I have reviewed the triage vital signs and the nursing notes.  Pertinent labs & imaging results that were available during my care of the patient were reviewed by me and considered in my medical decision making (see chart for details).  Treat for preseptal cellulitis with doxy BID x 7 days Will also treat for another bacterial conjunctivitis with Polytrim TID Discussed strict return and ED precautions if no change, or if worsening.   Also advised again to set up with PCP to discuss HTN. We also discussed this last week. Another QR code provided to scan asap to set up appointment  167/100  today  Final Clinical Impressions(s) / UC Diagnoses   Final diagnoses:  Preseptal cellulitis of left upper eyelid  Acute bacterial conjunctivitis of left eye     Discharge Instructions      Please take medication as prescribed. Take with food to avoid upset stomach. Finish all 7 days!  Please also use the new eye drops. 1 drop three times daily for the next 5-6 days  If no improvement by Saturday, please return Please go to the emergency department if symptoms worsen.     ED Prescriptions     Medication Sig Dispense Auth. Provider   doxycycline (VIBRAMYCIN) 100 MG capsule Take 1 capsule (100 mg total) by mouth 2 (two) times daily for 7 days. 14 capsule Ulyess Muto, PA-C   trimethoprim-polymyxin b (POLYTRIM) ophthalmic solution Place 1 drop into the left eye 3 (three) times daily as needed. 10 mL Richell Corker, Lurena Joiner, PA-C      PDMP not reviewed this encounter.   Marlow Baars, [place] 12/10/22 1333

## 2022-12-10 NOTE — ED Triage Notes (Addendum)
Left eye issue started las week.  Reports being seen last week for lower left eyelid swelling and pain.  Now that is better, but upper lid is getting worse.    Reports he has taken medicines prescribed to him at prior visit  Patient also says he takes blood pressure medicine sometimes.  Cannot tell the name of medicine
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
